# Patient Record
Sex: Male | Born: 1960 | Race: Black or African American | Hispanic: No | Marital: Single
Health system: Midwestern US, Community
[De-identification: ages and names within clinical notes are randomized; demographics above are authoritative.]

## PROBLEM LIST (undated history)

## (undated) DIAGNOSIS — I10 Essential (primary) hypertension: Secondary | ICD-10-CM

---

## 2007-06-21 LAB — METABOLIC PANEL, COMPREHENSIVE
A-G Ratio: 1.1 (ref 1.1–2.2)
ALT (SGPT): 34 U/L (ref 30–65)
AST (SGOT): 13 U/L — ABNORMAL LOW (ref 15–37)
Albumin: 4 g/dL (ref 3.5–5.0)
Alk. phosphatase: 100 U/L (ref 50–136)
Anion gap: 11 mmol/L (ref 5–15)
BUN/Creatinine ratio: 12 (ref 12–20)
BUN: 16 MG/DL (ref 6–20)
Bilirubin, total: 0.3 MG/DL (ref <1.0)
CO2: 26 MMOL/L (ref 21–32)
Calcium: 9.1 MG/DL (ref 8.5–10.1)
Chloride: 106 MMOL/L (ref 97–108)
Creatinine: 1.3 MG/DL (ref 0.6–1.3)
GFR est AA: >60 mL/min/{1.73_m2} (ref >60)
GFR est non-AA: >60 mL/min/{1.73_m2} (ref >60)
Globulin: 3.5 g/dL (ref 2.0–4.0)
Glucose: 104 MG/DL — ABNORMAL HIGH (ref 50–100)
Potassium: 4.3 MMOL/L (ref 3.5–5.1)
Protein, total: 7.5 g/dL (ref 6.4–8.2)
Sodium: 143 MMOL/L (ref 136–145)

## 2007-06-21 LAB — PSA, DIAGNOSTIC (PROSTATE SPECIFIC AG): Prostate Specific Ag: 0.7 NG/ML (ref <4.0)

## 2007-06-21 LAB — TSH 3RD GENERATION: TSH: 1.79 u[IU]/mL (ref 0.35–5.5)

## 2007-06-21 LAB — HDL CHOLESTEROL: HDL Cholesterol: 70 MG/DL — ABNORMAL HIGH (ref 40–60)

## 2007-06-21 LAB — CHOLESTEROL, TOTAL: Cholesterol, total: 216 MG/DL — ABNORMAL HIGH (ref <200)

## 2007-06-28 LAB — METABOLIC PANEL, BASIC
Anion gap: 13 mmol/L (ref 5–15)
BUN/Creatinine ratio: 16 (ref 12–20)
BUN: 21 MG/DL — ABNORMAL HIGH (ref 6–20)
CO2: 26 MMOL/L (ref 21–32)
Calcium: 9.7 MG/DL (ref 8.5–10.1)
Chloride: 99 MMOL/L (ref 97–108)
Creatinine: 1.3 MG/DL (ref 0.6–1.3)
GFR est AA: >60 mL/min/{1.73_m2} (ref >60)
GFR est non-AA: >60 mL/min/{1.73_m2} (ref >60)
Glucose: 111 MG/DL — ABNORMAL HIGH (ref 50–100)
Potassium: 4.4 MMOL/L (ref 3.5–5.1)
Sodium: 138 MMOL/L (ref 136–145)

## 2007-06-28 LAB — LIPID PANEL
CHOL/HDL Ratio: 3.2 (ref 0–5.0)
Cholesterol, total: 228 MG/DL — ABNORMAL HIGH (ref <200)
HDL Cholesterol: 71 MG/DL — ABNORMAL HIGH (ref 40–60)
LDL, calculated: 120 MG/DL — ABNORMAL HIGH (ref 0–100)
Triglyceride: 185 MG/DL (ref 30–200)
VLDL, calculated: 37 MG/DL

## 2008-01-02 LAB — POC CHEM8
Anion gap (POC): 12 (ref 5–15)
BUN (POC): 18 MG/DL (ref 9–20)
CO2 (POC): 27 MMOL/L (ref 21–32)
Calcium, ionized (POC): 1.16 MMOL/L (ref 1.12–1.32)
Chloride (POC): 103 MMOL/L (ref 98–107)
Creatinine (POC): 1.4 MG/DL — ABNORMAL HIGH (ref 0.6–1.3)
GFRAA, POC: >60 mL/min/{1.73_m2} (ref >60)
GFRNA, POC: 55 mL/min/{1.73_m2} — ABNORMAL LOW (ref >60)
Glucose (POC): 93 MG/DL (ref 75–110)
Hematocrit (POC): 42 % (ref 36.6–50.3)
Hemoglobin (POC): 14.3 GM/DL (ref 12.1–17.0)
Potassium (POC): 3.7 MMOL/L (ref 3.5–5.1)
Sodium (POC): 138 MMOL/L (ref 137–145)

## 2008-01-02 LAB — POC TROPONIN-I: Troponin-I (POC): <0.04 ng/mL (ref 0.00–0.08)

## 2009-03-05 ENCOUNTER — Encounter

## 2009-03-05 MED ORDER — LISINOPRIL-HYDROCHLOROTHIAZIDE 20 MG-12.5 MG TAB
ORAL_TABLET | Freq: Two times a day (BID) | ORAL | Status: AC
Start: 2009-03-05 — End: ?

## 2009-03-05 NOTE — Telephone Encounter (Signed)
Message copied by Carollee Herter on Wed Mar 05, 2009 12:19 PM  ------       Message from: Nehemiah Massed       Created: Wed Mar 05, 2009 12:01 PM       Regarding: Eric Blair   pt called         03-05-09  12:01pm Dr Tana Conch 2060/09/28       Pt called checking on status of rx Lisinopril being filled, states he is all out of the medication  dcf                   view/update  [id number]  [id number]  CLOSED  1  [facility]Physician Message  [email or web address]  03/04/2009  [email or web address]  [facility] 03/04/2009 [email or web address] 9:28am (DOB) 13-Mar-1960 Dr. [name] (H) [phone] Patient is requesting a refill on medication Lisinopril-hcpz to be called into [facility] pharmacy [phone]. Patient is out of medication. sent to [email or web address]

## 2009-03-05 NOTE — Telephone Encounter (Signed)
Med requested not on med list

## 2009-03-05 NOTE — Telephone Encounter (Signed)
rx was given for 30 days, no rf he needs office visit pt agreed

## 2019-03-22 LAB — HM COLONOSCOPY

## 2020-05-14 DIAGNOSIS — S93401A Sprain of unspecified ligament of right ankle, initial encounter: Secondary | ICD-10-CM

## 2020-05-15 ENCOUNTER — Inpatient Hospital Stay
Admit: 2020-05-15 | Discharge: 2020-05-15 | Disposition: A | Discharge status: Home / Self Care | Payer: MEDICAID | Attending: Emergency Medicine

## 2020-05-15 ENCOUNTER — Emergency Department: Admit: 2020-05-15 | Payer: MEDICAID | Primary: Family Medicine

## 2020-05-15 MED ORDER — OXYCODONE 5 MG TAB
5 mg | ORAL | Status: AC
Start: 2020-05-15 — End: 2020-05-15
  Administered 2020-05-15: 04:00:00 via ORAL

## 2020-05-15 MED ORDER — HYDROCODONE-ACETAMINOPHEN 5 MG-325 MG TAB
5-325 mg | ORAL_TABLET | ORAL | 0 refills | Status: AC | PRN
Start: 2020-05-15 — End: 2020-05-18

## 2020-05-15 MED FILL — OXYCODONE 5 MG TAB: 5 mg | ORAL | Qty: 1

## 2020-06-08 DEATH — deceased
